# Patient Record
Sex: Male | Born: 1995
Health system: Southern US, Community
[De-identification: ages and names within clinical notes are randomized; demographics above are authoritative.]

## PROBLEM LIST (undated history)

## (undated) DIAGNOSIS — K219 Gastro-esophageal reflux disease without esophagitis: Secondary | ICD-10-CM

## (undated) HISTORY — PX: DENTAL EXAMINATION UNDER ANESTHESIA: SHX1447

---
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
## 2019-08-12 ENCOUNTER — Other Ambulatory Visit: Payer: Self-pay

## 2019-08-12 ENCOUNTER — Ambulatory Visit: Payer: 59 | Admitting: Emergency Medicine

## 2019-08-12 ENCOUNTER — Encounter: Payer: Self-pay | Admitting: Emergency Medicine

## 2019-08-12 VITALS — BP 119/66 | HR 61 | Temp 99.2°F | Resp 16 | Ht 70.0 in | Wt 166.0 lb

## 2019-08-12 DIAGNOSIS — J020 Streptococcal pharyngitis: Secondary | ICD-10-CM

## 2019-08-12 LAB — POCT RAPID STREP A (OFFICE): Rapid Strep A Screen: NEGATIVE

## 2019-08-12 MED ORDER — PREDNISONE 10 MG (21) PO TBPK: ORAL_TABLET | ORAL | 0 refills | Status: DC

## 2019-08-12 NOTE — Progress Notes (Signed)
  Occupational Health Provider Note       Time seen: 4:36 PM    I have reviewed the vital signs and the nursing notes.  HISTORY   Chief Complaint Sore Throat    HPI Brian Hancock is a 24 y.o. male with no known past medical history who presents today for feeling like his throat was closing. Patient complains of sore throat but is more concerned he may be having a reaction that was causing his throat to close. He denies fevers, chills, chest pain, shortness of breath, vomiting or diarrhea. Patient denies any loss of taste or smell.  No past medical history on file.  Allergies Patient has no known allergies.  Review of Systems Constitutional: Negative for fever HEENT exam: Positive for sore throat difficulty swallowing Cardiovascular: Negative for chest pain. Respiratory: Negative for shortness of breath. Gastrointestinal: Negative for abdominal pain, vomiting and diarrhea. Musculoskeletal: Negative for back pain. Skin: Negative for rash. Neurological: Negative for headaches, focal weakness or numbness.  All systems negative/normal/unremarkable except as stated in the HPI  ____________________________________________   PHYSICAL EXAM:  VITAL SIGNS: Vitals:   08/12/19 1612  BP: 119/66  Pulse: 61  Resp: 16  Temp: 99.2 F (37.3 C)  SpO2: 100%    Constitutional: Alert and oriented. Well appearing and in no distress. Eyes: Conjunctivae are normal. Normal extraocular movements. ENT      Head: Normocephalic and atraumatic.      Nose: No congestion/rhinnorhea.      Mouth/Throat: Mucous membranes are moist. Posterior erythema is noted      Neck: No stridor. Cardiovascular: Normal rate, regular rhythm. No murmurs, rubs, or gallops. Respiratory: Normal respiratory effort without tachypnea nor retractions. Breath sounds are clear and equal bilaterally. No wheezes/rales/rhonchi. Musculoskeletal: Nontender with normal range of motion in extremities. No lower extremity  tenderness nor edema. Neurologic:  Normal speech and language. No gross focal neurologic deficits are appreciated.  Skin:  Skin is warm, dry and intact. No rash noted. Psychiatric: Speech and behavior are normal.  ____________________________________________   LABS (pertinent positives/negatives)  Recent Results (from the past 2160 hour(s))  POCT Rapid Strep A-Office (CPT [phone])     Status: Normal   Collection Time: 08/12/19  4:27 PM  Result Value Ref Range   Rapid Strep A Screen Negative Negative    DIFFERENTIAL DIAGNOSIS  Pharyngitis, postnasal drip, allergies, allergic reaction, tonsillitis  ASSESSMENT AND PLAN  Pharyngitis   Plan: The patient had presented for feeling like his throat was closing. Patient's labs were negative for strep. This does not look bacterial at this time, I will try steroid taper pack which should resolve his symptoms. If not he already has scheduled ENT referral.  Daryel November MD    Note: This note was generated in part or whole with voice recognition software. Voice recognition is usually quite accurate but there are transcription errors that can and very often do occur. I apologize for any typographical errors that were not detected and corrected.

## 2019-08-17 ENCOUNTER — Ambulatory Visit: Payer: Self-pay | Admitting: Physician Assistant

## 2019-08-17 ENCOUNTER — Other Ambulatory Visit: Payer: Self-pay

## 2019-08-17 VITALS — BP 118/71 | HR 60 | Temp 98.5°F | Resp 14 | Ht 69.0 in | Wt 163.0 lb

## 2019-08-17 DIAGNOSIS — J029 Acute pharyngitis, unspecified: Secondary | ICD-10-CM

## 2019-08-17 NOTE — Progress Notes (Signed)
° °  Subjective: Sore throat    Patient ID: Brian Hancock, male    DOB: 12/05/1995, 24 y.o.   MRN: [id number]  HPI Patient follow-up for sore throat from visit last week.  Patient stated no relief with steroids.  Patient denies fever, chills, dyspnea, vomiting, or diarrhea.  Patient states mild upper gastric discomfort.  Patient states tolerates soft foods and fluids with discomfort.  Review of Systems    Negative set for complaint Objective:   Physical Exam  HEENT is unremarkable.  No cervical adenopathy.      Assessment & Plan: Pharyngitis.  Differential consist viral tonsillitis, allergic reaction, and GERD.  Patient advised to take over-the-counter Pepcid and will be scheduled ENT consult.  Follow-up after ENT evaluation.

## 2019-08-17 NOTE — Progress Notes (Signed)
Pt has finished rounds of steroids this morning and his throat still hurts. Pt states it hasnt got worse that its still the same. CL/RMA

## 2019-08-17 NOTE — Addendum Note (Signed)
Addended by: Gardner Candle on: 08/17/2019 10:22 AM   Modules accepted: Orders

## 2019-08-18 ENCOUNTER — Telehealth: Payer: Self-pay

## 2019-08-18 ENCOUNTER — Encounter: Payer: Self-pay | Admitting: Emergency Medicine

## 2019-08-18 ENCOUNTER — Emergency Department: Payer: 59

## 2019-08-18 ENCOUNTER — Other Ambulatory Visit: Payer: Self-pay

## 2019-08-18 ENCOUNTER — Emergency Department
Admission: EM | Admit: 2019-08-18 | Discharge: 2019-08-18 | Disposition: A | Discharge status: Home / Self Care | Payer: 59 | Attending: Emergency Medicine | Admitting: Emergency Medicine

## 2019-08-18 DIAGNOSIS — R1310 Dysphagia, unspecified: Secondary | ICD-10-CM

## 2019-08-18 DIAGNOSIS — R0602 Shortness of breath: Secondary | ICD-10-CM | POA: Diagnosis not present

## 2019-08-18 DIAGNOSIS — R918 Other nonspecific abnormal finding of lung field: Secondary | ICD-10-CM | POA: Diagnosis not present

## 2019-08-18 LAB — CBC WITH DIFFERENTIAL/PLATELET
Abs Immature Granulocytes: 0.03 10*3/uL (ref 0.00–0.07)
Basophils Absolute: 0.1 10*3/uL (ref 0.0–0.1)
Basophils Relative: 1 %
Eosinophils Absolute: 0.3 10*3/uL (ref 0.0–0.5)
Eosinophils Relative: 4 %
HCT: 45.2 % (ref 39.0–52.0)
Hemoglobin: 15.9 g/dL (ref 13.0–17.0)
Immature Granulocytes: 0 %
Lymphocytes Relative: 47 %
Lymphs Abs: 3.4 10*3/uL (ref 0.7–4.0)
MCH: 31.8 pg (ref 26.0–34.0)
MCHC: 35.2 g/dL (ref 30.0–36.0)
MCV: 90.4 fL (ref 80.0–100.0)
Monocytes Absolute: 0.6 10*3/uL (ref 0.1–1.0)
Monocytes Relative: 9 %
Neutro Abs: 2.8 10*3/uL (ref 1.7–7.7)
Neutrophils Relative %: 39 %
Platelets: 312 10*3/uL (ref 150–400)
RBC: 5 MIL/uL (ref 4.22–5.81)
RDW: 11.7 % (ref 11.5–15.5)
WBC: 7.2 10*3/uL (ref 4.0–10.5)
nRBC: 0 % (ref 0.0–0.2)

## 2019-08-18 LAB — BASIC METABOLIC PANEL
Anion gap: 8 (ref 5–15)
BUN: 14 mg/dL (ref 6–20)
CO2: 29 mmol/L (ref 22–32)
Calcium: 9.3 mg/dL (ref 8.9–10.3)
Chloride: 101 mmol/L (ref 98–111)
Creatinine, Ser: 0.98 mg/dL (ref 0.61–1.24)
GFR calc Af Amer: >60 mL/min (ref >60)
GFR calc non Af Amer: >60 mL/min (ref >60)
Glucose, Bld: 105 mg/dL — ABNORMAL HIGH (ref 70–99)
Potassium: 3.5 mmol/L (ref 3.5–5.1)
Sodium: 138 mmol/L (ref 135–145)

## 2019-08-18 MED ORDER — OMEPRAZOLE 20 MG PO CPDR: 40.0000 mg | DELAYED_RELEASE_CAPSULE | Freq: Two times a day (BID) | ORAL | 2 refills | Status: DC

## 2019-08-18 NOTE — ED Triage Notes (Signed)
Patient to ER for c/o shortness of breath/difficulty swallowing x1 week. Patient states he has seen PCP for sensation that food is sitting at esophagus/epigastric area. Patient states symptoms have progressed and feels like he has been unable to eat well the last 3-4 days. Reports difficulty swallowing has improved with prednisone, but still has sensation that food is sitting. Now describes a shortness of breath feeling.

## 2019-08-18 NOTE — Telephone Encounter (Signed)
Per Dr. Allegra Lai patient needs a urgent EGD. Scheduled patient for 08/24/2019. Went over instructions with patient and mailed them. Patient will go for COVID test on 08/20/2019.

## 2019-08-18 NOTE — ED Provider Notes (Signed)
[facility] Emergency Department Provider Note   ____________________________________________   First MD Initiated Contact with Patient 08/18/19 1419     (approximate)  I have reviewed the triage vital signs and the nursing notes.   HISTORY  Chief Complaint Shortness of Breath and Dysphagia    HPI Brian Hancock is a 24 y.o. male with no significant past medical history who presents to the ED complaining of difficulty swallowing.  Patient reports that about 1 week ago he first developed a feeling of tightness in his throat.  He was initially evaluated at a walk-in clinic where there was concern for pharyngitis and he was started on a steroid taper.  Strep testing was negative at that time.  Symptoms did not improve after starting the steroids and he now feels like the tightness extends down into the middle of his chest.  He has significant difficulty swallowing, states whenever he tries to eat solids it gets stuck in his lower neck and mid chest, very gradually progressing through to his stomach.  He has taken Pepcid as needed with no relief of his symptoms.  He has been drinking protein shakes without difficulty and denies any vomiting.  He has never had similar symptoms in the past.  He currently denies any chest pain or difficulty breathing.        History reviewed. No pertinent past medical history.  There are no problems to display for this patient.   Past Surgical History:  Procedure Laterality Date   DENTAL EXAMINATION UNDER ANESTHESIA      Prior to Admission medications   Medication Sig Start Date End Date Taking? Authorizing Provider  cetirizine (ZYRTEC) 10 MG tablet Take 10 mg by mouth daily.    [provider]  omeprazole (PRILOSEC) 20 MG capsule Take 2 capsules (40 mg total) by mouth 2 (two) times daily before a meal. 08/18/19 10/02/19  Blake Divine, MD    Allergies Patient has no known allergies.  Family History  Problem  Relation Age of Onset   Skin cancer Mother    Skin cancer Maternal Grandmother    Skin cancer Paternal Grandfather     Social History Social History   Tobacco Use   Smoking status: Never Smoker   Smokeless tobacco: Current User    Types: Chew  Substance Use Topics   Alcohol use: Yes    Alcohol/week: 4.0 standard drinks    Types: 4 Cans of beer per week    Comment: a week   Drug use: Never    Review of Systems  Constitutional: No fever/chills Eyes: No visual changes. ENT: No sore throat. Cardiovascular: Denies chest pain. Respiratory: Denies shortness of breath. Gastrointestinal: No abdominal pain.  No nausea, no vomiting.  No diarrhea.  No constipation.  Positive for dysphagia. Genitourinary: Negative for dysuria. Musculoskeletal: Negative for back pain. Skin: Negative for rash. Neurological: Negative for headaches, focal weakness or numbness.  ____________________________________________   PHYSICAL EXAM:  VITAL SIGNS: ED Triage Vitals  Enc Vitals Group     BP 08/18/19 1314 124/74     Pulse Rate 08/18/19 1314 65     Resp 08/18/19 1314 18     Temp 08/18/19 1314 98.3 F (36.8 C)     Temp Source 08/18/19 1314 Oral     SpO2 08/18/19 1314 100 %     Weight 08/18/19 1316 163 lb (73.9 kg)     Height 08/18/19 1316 5\' 9"  (1.753 m)     Head Circumference --  Peak Flow --      Pain Score 08/18/19 1316 0     Pain Loc --      Pain Edu? --      Excl. in GC? --     Constitutional: Alert and oriented. Eyes: Conjunctivae are normal. Head: Atraumatic. Nose: No congestion/rhinnorhea. Mouth/Throat: Mucous membranes are moist.  Oropharynx clear with no erythema or edema. Neck: Normal ROM Cardiovascular: Normal rate, regular rhythm. Grossly normal heart sounds. Respiratory: Normal respiratory effort.  No retractions. Lungs CTAB. Gastrointestinal: Soft and nontender. No distention. Genitourinary: deferred Musculoskeletal: No lower extremity tenderness nor  edema. Neurologic:  Normal speech and language. No gross focal neurologic deficits are appreciated. Skin:  Skin is warm, dry and intact. No rash noted. Psychiatric: Mood and affect are normal. Speech and behavior are normal.  ____________________________________________   LABS (all labs ordered are listed, but only abnormal results are displayed)  Labs Reviewed  BASIC METABOLIC PANEL - Abnormal; Notable for the following components:      Result Value   Glucose, Bld 105 (*)    All other components within normal limits  CBC WITH DIFFERENTIAL/PLATELET   ____________________________________________  EKG  ED ECG REPORT I, Chesley Noon, the attending physician, personally viewed and interpreted this ECG.   Date: 08/18/2019  EKG Time: 13:08  Rate: 70  Rhythm: normal sinus rhythm  Axis: Normal  Intervals:none  ST&T Change: None   PROCEDURES  Procedure(s) performed (including Critical Care):  Procedures   ____________________________________________   INITIAL IMPRESSION / ASSESSMENT AND PLAN / ED COURSE       24 year old male with no significant past medical history presents to the ED after he initially developed tightness in his throat about 1 week ago, subsequently has developed increasing difficulty swallowing solids, feeling like they get stuck in the center of his chest.  EKG shows no evidence of arrhythmia or ischemia and chest x-ray negative for acute process.  Lab work also unremarkable and I doubt acute cardiac or pulmonary etiology.  His symptoms seem most likely to be esophageal in origin and we will further assess with barium swallow.  Barium swallow is unremarkable, patient is tolerating liquids here in the ED without difficulty.  Case was discussed with Dr. Allegra Lai of GI, who recommends starting patient on 40 mg omeprazole twice daily and will assist with follow-up for potential endoscopy.  Patient counseled to return to the ED for any new or worsening symptoms,  patient and family agree with plan.      ____________________________________________   FINAL CLINICAL IMPRESSION(S) / ED DIAGNOSES  Final diagnoses:  Dysphagia     ED Discharge Orders         Ordered    omeprazole (PRILOSEC) 20 MG capsule  2 times daily before meals     08/18/19 1541           Note:  This document was prepared using Dragon voice recognition software and may include unintentional dictation errors.   Chesley Noon, MD 08/18/19 [phone]

## 2019-08-20 ENCOUNTER — Other Ambulatory Visit: Payer: Self-pay

## 2019-08-20 ENCOUNTER — Other Ambulatory Visit
Admission: RE | Admit: 2019-08-20 | Discharge: 2019-08-20 | Disposition: A | Discharge status: Home / Self Care | Payer: 59 | Source: Ambulatory Visit | Attending: Gastroenterology | Admitting: Gastroenterology

## 2019-08-20 DIAGNOSIS — Z20822 Contact with and (suspected) exposure to covid-19: Secondary | ICD-10-CM | POA: Insufficient documentation

## 2019-08-20 DIAGNOSIS — Z01812 Encounter for preprocedural laboratory examination: Secondary | ICD-10-CM | POA: Insufficient documentation

## 2019-08-20 LAB — SARS CORONAVIRUS 2 (TAT 6-24 HRS): SARS Coronavirus 2: NEGATIVE

## 2019-08-21 ENCOUNTER — Encounter: Payer: Self-pay | Admitting: Gastroenterology

## 2019-08-24 ENCOUNTER — Ambulatory Visit: Payer: 59 | Admitting: Registered Nurse

## 2019-08-24 ENCOUNTER — Encounter
Admission: RE | Disposition: A | Discharge status: Home / Self Care | Payer: Self-pay | Source: Home / Self Care | Attending: Gastroenterology

## 2019-08-24 ENCOUNTER — Ambulatory Visit
Admission: RE | Admit: 2019-08-24 | Discharge: 2019-08-24 | Disposition: A | Discharge status: Home / Self Care | Payer: 59 | Attending: Gastroenterology | Admitting: Gastroenterology

## 2019-08-24 ENCOUNTER — Other Ambulatory Visit: Payer: Self-pay

## 2019-08-24 ENCOUNTER — Encounter: Payer: Self-pay | Admitting: Gastroenterology

## 2019-08-24 DIAGNOSIS — Z79899 Other long term (current) drug therapy: Secondary | ICD-10-CM | POA: Diagnosis not present

## 2019-08-24 DIAGNOSIS — R1310 Dysphagia, unspecified: Secondary | ICD-10-CM | POA: Diagnosis not present

## 2019-08-24 DIAGNOSIS — K219 Gastro-esophageal reflux disease without esophagitis: Secondary | ICD-10-CM | POA: Diagnosis not present

## 2019-08-24 DIAGNOSIS — K228 Other specified diseases of esophagus: Secondary | ICD-10-CM | POA: Diagnosis not present

## 2019-08-24 DIAGNOSIS — R1314 Dysphagia, pharyngoesophageal phase: Secondary | ICD-10-CM | POA: Diagnosis not present

## 2019-08-24 HISTORY — DX: Gastro-esophageal reflux disease without esophagitis: K21.9

## 2019-08-24 HISTORY — PX: ESOPHAGOGASTRODUODENOSCOPY (EGD) WITH PROPOFOL: SHX5813

## 2019-08-24 SURGERY — ESOPHAGOGASTRODUODENOSCOPY (EGD) WITH PROPOFOL
Anesthesia: General

## 2019-08-24 MED ORDER — SODIUM CHLORIDE 0.9 % IV SOLN: INTRAVENOUS | Status: DC

## 2019-08-24 MED ORDER — PROPOFOL 500 MG/50ML IV EMUL
INTRAVENOUS | Status: DC | PRN
  Administered 2019-08-24: 200 ug/kg/min via INTRAVENOUS

## 2019-08-24 MED ORDER — GLYCOPYRROLATE 0.2 MG/ML IJ SOLN
INTRAMUSCULAR | Status: AC
  Filled 2019-08-24: qty 1

## 2019-08-24 MED ORDER — LABETALOL HCL 5 MG/ML IV SOLN
INTRAVENOUS | Status: AC
  Filled 2019-08-24: qty 4

## 2019-08-24 MED ORDER — PROPOFOL 10 MG/ML IV BOLUS
INTRAVENOUS | Status: AC
  Filled 2019-08-24: qty 20

## 2019-08-24 MED ORDER — DEXMEDETOMIDINE HCL 200 MCG/2ML IV SOLN
INTRAVENOUS | Status: DC | PRN
  Administered 2019-08-24: 20 ug via INTRAVENOUS
  Administered 2019-08-24: 8 ug via INTRAVENOUS

## 2019-08-24 MED ORDER — PROPOFOL 10 MG/ML IV BOLUS
INTRAVENOUS | Status: DC | PRN
  Administered 2019-08-24: 100 mg via INTRAVENOUS

## 2019-08-24 MED ORDER — GLYCOPYRROLATE 0.2 MG/ML IJ SOLN
INTRAMUSCULAR | Status: DC | PRN
  Administered 2019-08-24: .2 mg via INTRAVENOUS

## 2019-08-24 MED ORDER — LIDOCAINE HCL (CARDIAC) PF 100 MG/5ML IV SOSY
PREFILLED_SYRINGE | INTRAVENOUS | Status: DC | PRN
  Administered 2019-08-24: 100 mg via INTRAVENOUS

## 2019-08-24 NOTE — H&P (Signed)
Arlyss Repress, MD [address]  [address]  [place], [place] [address]  Main: [phone]  Fax: [phone] Pager: [phone]  Primary Care Physician:  Patient, No Pcp Per Primary Gastroenterologist:  Dr. Arlyss Repress  Pre-Procedure History & Physical: HPI:  Brian Hancock is a 24 y.o. male is here for an endoscopy.   Past Medical History:  Diagnosis Date  . GERD (gastroesophageal reflux disease)     Past Surgical History:  Procedure Laterality Date  . DENTAL EXAMINATION UNDER ANESTHESIA      Prior to Admission medications   Medication Sig Start Date End Date Taking? Authorizing Provider  cetirizine (ZYRTEC) 10 MG tablet Take 10 mg by mouth daily.   Yes [provider]  omeprazole (PRILOSEC) 20 MG capsule Take 2 capsules (40 mg total) by mouth 2 (two) times daily before a meal. 08/18/19 10/02/19 Yes Chesley Noon, MD    Allergies as of 08/19/2019  . (No Known Allergies)    Family History  Problem Relation Age of Onset  . Skin cancer Mother   . Skin cancer Maternal Grandmother   . Skin cancer Paternal Grandfather     Social History   Socioeconomic History  . Marital status: Single    Spouse name: Not on file  . Number of children: Not on file  . Years of education: Not on file  . Highest education level: Not on file  Occupational History  . Not on file  Tobacco Use  . Smoking status: Never Smoker  . Smokeless tobacco: Current User    Types: Chew  Vaping Use  . Vaping Use: Never used  Substance and Sexual Activity  . Alcohol use: Yes    Alcohol/week: 4.0 standard drinks    Types: 4 Cans of beer per week    Comment: a week  . Drug use: Never  . Sexual activity: Not on file  Other Topics Concern  . Not on file  Social History Narrative  . Not on file   Social Determinants of Health   Financial Resource Strain:   . Difficulty of Paying Living Expenses:   Food Insecurity:   . Worried About Programme researcher, broadcasting/film/video in the Last Year:     . Barista in the Last Year:   Transportation Needs:   . Freight forwarder (Medical):   Marland Kitchen Lack of Transportation (Non-Medical):   Physical Activity:   . Days of Exercise per Week:   . Minutes of Exercise per Session:   Stress:   . Feeling of Stress :   Social Connections:   . Frequency of Communication with Friends and Family:   . Frequency of Social Gatherings with Friends and Family:   . Attends Religious Services:   . Active Member of Clubs or Organizations:   . Attends Banker Meetings:   Marland Kitchen Marital Status:   Intimate Partner Violence:   . Fear of Current or Ex-Partner:   . Emotionally Abused:   Marland Kitchen Physically Abused:   . Sexually Abused:     Review of Systems: See HPI, otherwise negative ROS  Physical Exam: BP 124/75   Pulse 60   Temp 98 F (36.7 C) (Temporal)   Resp 15   Ht 5\' 9"  (1.753 m)   Wt 71.2 kg   SpO2 100%   BMI 23.18 kg/m  General:   Alert,  pleasant and cooperative in NAD Head:  Normocephalic and atraumatic. Neck:  Supple; no masses or thyromegaly. Lungs:  Clear throughout to auscultation.    Heart:  Regular rate and rhythm. Abdomen:  Soft, nontender and nondistended. Normal bowel sounds, without guarding, and without rebound.   Neurologic:  Alert and  oriented x4;  grossly normal neurologically.  Impression/Plan: Brian Hancock is here for an endoscopy to be performed for dysphagia  Risks, benefits, limitations, and alternatives regarding  endoscopy have been reviewed with the patient.  Questions have been answered.  All parties agreeable.   Sherri Sear, MD  08/24/2019, 11:51 AM

## 2019-08-24 NOTE — Op Note (Signed)
[facility] Gastroenterology Patient Name: Brian Hancock Procedure Date: 08/24/2019 12:39 PM MRN: [id number] Account #: [id number] Date of Birth: Mar 08, 1996 Admit Type: Outpatient Age: 24 Room: [facility] ENDO ROOM 1 Gender: Male Note Status: Finalized Procedure:             Upper GI endoscopy Indications:           Esophageal dysphagia Providers:             Lin Landsman MD, MD Medicines:             Monitored Anesthesia Care Complications:         No immediate complications. Estimated blood loss: None. Procedure:             Pre-Anesthesia Assessment:                        - Prior to the procedure, a History and Physical was                         performed, and patient medications and allergies were                         reviewed. The patient is competent. The risks and                         benefits of the procedure and the sedation options and                         risks were discussed with the patient. All questions                         were answered and informed consent was obtained.                         Patient identification and proposed procedure were                         verified by the physician, the nurse, the                         anesthesiologist, the anesthetist and the technician                         in the pre-procedure area in the procedure room in the                         endoscopy suite. Mental Status Examination: alert and                         oriented. Airway Examination: normal oropharyngeal                         airway and neck mobility. Respiratory Examination:                         clear to auscultation. CV Examination: normal.                         Prophylactic Antibiotics: The patient does not require  prophylactic antibiotics. Prior Anticoagulants: The                         patient has taken no previous anticoagulant or                         antiplatelet agents. ASA Grade  Assessment: II - A                         patient with mild systemic disease. After reviewing                         the risks and benefits, the patient was deemed in                         satisfactory condition to undergo the procedure. The                         anesthesia plan was to use monitored anesthesia care                         (MAC). Immediately prior to administration of                         medications, the patient was re-assessed for adequacy                         to receive sedatives. The heart rate, respiratory                         rate, oxygen saturations, blood pressure, adequacy of                         pulmonary ventilation, and response to care were                         monitored throughout the procedure. The physical                         status of the patient was re-assessed after the                         procedure.                        After obtaining informed consent, the endoscope was                         passed under direct vision. Throughout the procedure,                         the patient's blood pressure, pulse, and oxygen                         saturations were monitored continuously. The Endoscope                         was introduced through the mouth, and advanced to the  second part of duodenum. The upper GI endoscopy was                         accomplished without difficulty. The patient tolerated                         the procedure well. Findings:      The duodenal bulb and second portion of the duodenum were normal.      The entire examined stomach was normal.      The cardia and gastric fundus were normal on retroflexion.      Esophagogastric landmarks were identified: the gastroesophageal junction       was found at 40 cm from the incisors.      Mucosal changes including longitudinal markings and mucosal friability       were found in the lower third of the esophagus. Esophageal findings  were       graded using the Eosinophilic Esophagitis Endoscopic Reference Score       (EoE-EREFS) as: Edema Grade 0 Normal (distinct vascular markings), Rings       Grade 0 None (no ridges or rings seen), Exudates Grade 0 None (no white       lesions seen), Furrows Grade 0 None (no vertical lines seen) and       Stricture none (no stricture found). Biopsies were obtained from the       proximal and distal esophagus with cold forceps for histology of       suspected eosinophilic esophagitis. Impression:            - Normal duodenal bulb and second portion of the                         duodenum.                        - Normal stomach.                        - Esophagogastric landmarks identified.                        - Esophageal mucosal changes suspicious for                         eosinophilic esophagitis. Biopsied. Recommendation:        - Discharge patient to home (with escort).                        - Resume previous diet today.                        - Continue present medications.                        - Await pathology results.                        - Return to my office in 2 months. Procedure Code(s):     --- Professional ---                        [phone], Esophagogastroduodenoscopy, flexible,  transoral; with biopsy, single or multiple Diagnosis Code(s):     --- Professional ---                        K22.8, Other specified diseases of esophagus                        R13.14, Dysphagia, pharyngoesophageal phase CPT copyright 2019 American Medical Association. All rights reserved. The codes documented in this report are preliminary and upon coder review may  be revised to meet current compliance requirements. Dr. Ulyess Mort Lin Landsman MD, MD 08/24/2019 12:58:32 PM This report has been signed electronically. Number of Addenda: 0 Note Initiated On: 08/24/2019 12:39 PM Estimated Blood Loss:  Estimated blood loss: none.      [facility]

## 2019-08-24 NOTE — Anesthesia Preprocedure Evaluation (Signed)
Anesthesia Evaluation  Patient identified by MRN, date of birth, ID band Patient awake    Reviewed: Allergy & Precautions, NPO status , Patient's Chart, lab work & pertinent test results  History of Anesthesia Complications Negative for: history of anesthetic complications  Airway Mallampati: II       Dental   Pulmonary neg sleep apnea, neg COPD, Not current smoker,           Cardiovascular (-) hypertension(-) Past MI and (-) CHF (-) dysrhythmias (-) Valvular Problems/Murmurs     Neuro/Psych neg Seizures    GI/Hepatic Neg liver ROS, GERD  Medicated and Controlled,  Endo/Other  neg diabetes  Renal/GU negative Renal ROS     Musculoskeletal   Abdominal   Peds  Hematology   Anesthesia Other Findings   Reproductive/Obstetrics                             Anesthesia Physical Anesthesia Plan  ASA: II  Anesthesia Plan: General   Post-op Pain Management:    Induction: Intravenous  PONV Risk Score and Plan: Propofol infusion and TIVA  Airway Management Planned: Nasal Cannula  Additional Equipment:   Intra-op Plan:   Post-operative Plan:   Informed Consent: I have reviewed the patients History and Physical, chart, labs and discussed the procedure including the risks, benefits and alternatives for the proposed anesthesia with the patient or authorized representative who has indicated his/her understanding and acceptance.       Plan Discussed with:   Anesthesia Plan Comments:         Anesthesia Quick Evaluation

## 2019-08-24 NOTE — Transfer of Care (Signed)
Immediate Anesthesia Transfer of Care Note  Patient: Brian Hancock  Procedure(s) Performed: ESOPHAGOGASTRODUODENOSCOPY (EGD) WITH PROPOFOL (N/A )  Patient Location: Endoscopy Unit  Anesthesia Type:General  Level of Consciousness: drowsy  Airway & Oxygen Therapy: Patient Spontanous Breathing  Post-op Assessment: Report given to RN and Post -op Vital signs reviewed and stable  Post vital signs: Reviewed and stable  Last Vitals:  Vitals Value Taken Time  BP    Temp    Pulse 55 08/24/19 1302  Resp 19 08/24/19 1302  SpO2 96 % 08/24/19 1302  Vitals shown include unvalidated device data.  Last Pain:  Vitals:   08/24/19 1108  TempSrc: Temporal  PainSc: 0-No pain         Complications: No complications documented.

## 2019-08-25 ENCOUNTER — Encounter: Payer: Self-pay | Admitting: Gastroenterology

## 2019-08-25 LAB — SURGICAL PATHOLOGY

## 2019-08-25 NOTE — Anesthesia Postprocedure Evaluation (Signed)
Anesthesia Post Note  Patient: Brian Hancock  Procedure(s) Performed: ESOPHAGOGASTRODUODENOSCOPY (EGD) WITH PROPOFOL (N/A )  Patient location during evaluation: PACU Anesthesia Type: General Level of consciousness: awake and alert Pain management: pain level controlled Vital Signs Assessment: post-procedure vital signs reviewed and stable Respiratory status: spontaneous breathing, nonlabored ventilation and respiratory function stable Cardiovascular status: blood pressure returned to baseline and stable Postop Assessment: no apparent nausea or vomiting Anesthetic complications: no   No complications documented.   Last Vitals:  Vitals:   08/24/19 1323 08/24/19 1333  BP: (!) 98/51 108/66  Pulse: (!) 55 (!) 50  Resp: 16 17  Temp:    SpO2: 99% 99%    Last Pain:  Vitals:   08/25/19 0738  TempSrc:   PainSc: 0-No pain                 Karleen Hampshire

## 2019-08-31 ENCOUNTER — Other Ambulatory Visit: Payer: Self-pay

## 2019-08-31 MED ORDER — OMEPRAZOLE 20 MG PO CPDR: 40.0000 mg | DELAYED_RELEASE_CAPSULE | Freq: Two times a day (BID) | ORAL | 2 refills | Status: DC

## 2019-08-31 NOTE — Telephone Encounter (Signed)
Patient is calling patient states he was seen on 08/18/2019 for Dysphagia in the ER. Had EGD done on 08/24/2019. Patient wants refill on omeprazole. Has appointment with you on 09/24/2019

## 2019-09-01 MED ORDER — OMEPRAZOLE 20 MG PO CPDR: 40.0000 mg | DELAYED_RELEASE_CAPSULE | Freq: Two times a day (BID) | ORAL | 2 refills | Status: DC

## 2019-09-01 NOTE — Addendum Note (Signed)
Addended by: Radene Knee L on: 09/01/2019 07:45 AM   Modules accepted: Orders

## 2019-09-07 ENCOUNTER — Other Ambulatory Visit: Payer: Self-pay

## 2019-09-07 ENCOUNTER — Telehealth: Payer: Self-pay | Admitting: Gastroenterology

## 2019-09-07 MED ORDER — OMEPRAZOLE 20 MG PO CPDR: 40.0000 mg | DELAYED_RELEASE_CAPSULE | Freq: Two times a day (BID) | ORAL | 2 refills | Status: DC

## 2019-09-07 NOTE — Telephone Encounter (Signed)
error 

## 2019-09-07 NOTE — Telephone Encounter (Signed)
Patient is calling because he only got 30 capsules of his omeprazole. Pharmacy states they need a new RX sent it to the pharmacy informed patient

## 2019-09-08 ENCOUNTER — Telehealth: Payer: Self-pay

## 2019-09-08 NOTE — Telephone Encounter (Signed)
Medication was approved by insurance and called and informed patient of this information

## 2019-09-08 NOTE — Telephone Encounter (Signed)
Pharmacy called to state that patient needed a PA on Omeprazole 20mg  take 2 capsules twice a day. Did PA through cover my meds. Patient left a voicemail stating he had a question about medication. Called patient back and informed patient his medication needed a PA and this was sent off to insurance this morning. Informed patient that this could take 24-72 hours for insurance to make a decision. Patient states he runs out of medication tomorrow. Informed patient this was available otc but would let him know as soon as I hear back from insurance

## 2019-09-24 ENCOUNTER — Other Ambulatory Visit: Payer: Self-pay

## 2019-09-24 ENCOUNTER — Encounter: Payer: Self-pay | Admitting: Gastroenterology

## 2019-09-24 ENCOUNTER — Ambulatory Visit (INDEPENDENT_AMBULATORY_CARE_PROVIDER_SITE_OTHER): Payer: 59 | Admitting: Gastroenterology

## 2019-09-24 VITALS — BP 115/72 | HR 76 | Temp 98.0°F | Wt 163.0 lb

## 2019-09-24 DIAGNOSIS — K200 Eosinophilic esophagitis: Secondary | ICD-10-CM | POA: Diagnosis not present

## 2019-09-24 MED ORDER — OMEPRAZOLE 40 MG PO CPDR: 40.0000 mg | DELAYED_RELEASE_CAPSULE | Freq: Two times a day (BID) | ORAL | 2 refills | Status: DC

## 2019-09-24 NOTE — Progress Notes (Signed)
Arlyss Repress, MD [address]  [address]  [place], [place] [address]  Main: [phone]  Fax: [phone]    Gastroenterology Consultation  Referring Provider:     No ref. provider found Primary Care Physician:  Patient, No Pcp Per Primary Gastroenterologist:  Dr. Arlyss Repress Reason for Consultation:     Eosinophilic esophagitis        HPI:   Brian Hancock is a 24 y.o. male referred by Dr. Patient, No Pcp Per  for consultation & management of history of leg esophagitis.  Patient was originally seen at [facility] ER on 08/18/2019 secondary to progressively worsening dysphagia to solids associated with choking.  He was originally prescribed a short course of prednisone as it was thought that he may had an allergic reaction.  However, given that his symptoms progressed, he was referred to GI.  I performed his upper endoscopy in 08/2019, confirmed eosinophilic esophagitis.  He was maintained on omeprazole twice daily.  Today, he is here to establish care as outpatient.  Patient is accompanied by his stepmother.  He reports that since his upper endoscopy, he did not have further episodes of dysphagia.  He thinks his symptoms have resolved and he is able to tolerate regular foods.  His weight has been stable.  He works for a paper company  He does admit to seasonal allergies.  He denies history of allergic asthma He denies family history of EOE, autoimmune conditions or asthma  NSAIDs: None  Antiplts/Anticoagulants/Anti thrombotics: None  GI Procedures: EGD 08/30/2019 - Normal duodenal bulb and second portion of the duodenum. - Normal stomach. - Esophagogastric landmarks identified. - Esophageal mucosal changes suspicious for eosinophilic esophagitis. Biopsied.  DIAGNOSIS:  A. ESOPHAGUS, DISTAL; COLD BIOPSY:  - BENIGN SQUAMOUS MUCOSA WITH ACANTHOSIS.  - MILD INCREASE IN INTRAEPITHELIAL EOSINOPHILS (UP TO 6 PER HPF)  - NEGATIVE FOR DYSPLASIA OR MALIGNANCY.   B. ESOPHAGUS,  PROXIMAL; COLD BIOPSY:  - BENIGN SQUAMOUS MUCOSA WITH ACANTHOSIS.  - MODERATE INCREASE IN INTRAEPITHELIAL EOSINOPHILS (UP TO 15 PER HPF IN  ONE FOCUS).  - NEGATIVE FOR DYSPLASIA AND MALIGNANCY.   Past Medical History:  Diagnosis Date  . GERD (gastroesophageal reflux disease)     Past Surgical History:  Procedure Laterality Date  . DENTAL EXAMINATION UNDER ANESTHESIA    . ESOPHAGOGASTRODUODENOSCOPY (EGD) WITH PROPOFOL N/A 08/24/2019   Procedure: ESOPHAGOGASTRODUODENOSCOPY (EGD) WITH PROPOFOL;  Surgeon: Toney Reil, MD;  Location[facility] ENDOSCOPY;  Service: Gastroenterology;  Laterality: N/A;    Current Outpatient Medications:  .  omeprazole (PRILOSEC) 40 MG capsule, Take 1 capsule (40 mg total) by mouth 2 (two) times daily before a meal., Disp: 60 capsule, Rfl: 2    Family History  Problem Relation Age of Onset  . Skin cancer Mother   . Skin cancer Maternal Grandmother   . Skin cancer Paternal Grandfather      Social History   Tobacco Use  . Smoking status: Never Smoker  . Smokeless tobacco: Current User    Types: Chew  Vaping Use  . Vaping Use: Never used  Substance Use Topics  . Alcohol use: Yes    Alcohol/week: 4.0 standard drinks    Types: 4 Cans of beer per week    Comment: a week  . Drug use: Never    Allergies as of 09/24/2019  . (No Known Allergies)    Review of Systems:    All systems reviewed and negative except where noted in HPI.   Physical  Exam:  BP 115/72 (BP Location: Left Arm, Patient Position: Sitting, Cuff Size: Normal)   Pulse 76   Temp 98 F (36.7 C) (Oral)   Wt 163 lb (73.9 kg)   BMI 24.07 kg/m  No LMP for male patient.  General:   Alert,  Well-developed, well-nourished, pleasant and cooperative in NAD Head:  Normocephalic and atraumatic. Eyes:  Sclera clear, no icterus.   Conjunctiva pink. Ears:  Normal auditory acuity. Nose:  No deformity, discharge, or lesions. Mouth:  No deformity or lesions,oropharynx pink &  moist. Neck:  Supple; no masses or thyromegaly. Lungs:  Respirations even and unlabored.  Clear throughout to auscultation.   No wheezes, crackles, or rhonchi. No acute distress. Heart:  Regular rate and rhythm; no murmurs, clicks, rubs, or gallops. Abdomen:  Normal bowel sounds. Soft, non-tender and non-distended without masses, hepatosplenomegaly or hernias noted.  No guarding or rebound tenderness.   Rectal: Not performed Msk:  Symmetrical without gross deformities. Good, equal movement & strength bilaterally. Pulses:  Normal pulses noted. Extremities:  No clubbing or edema.  No cyanosis. Neurologic:  Alert and oriented x3;  grossly normal neurologically. Skin:  Intact without significant lesions or rashes. No jaundice. Psych:  Alert and cooperative. Normal mood and affect.  Imaging Studies: Reviewed  Assessment and Plan:   [name] is a 24 y.o. male with no significant past medical history, is here for follow-up of newly diagnosed eosinophilic esophagitis  Today, I have discussed with him various management options of EOE such as PPI use, steroid use, 6 food elimination diet.  I have also discussed with him the sequelae if left untreated including complications  Given that he is in clinical remission, I recommend to continue omeprazole 40 mg 2 times a day for 2 months, repeat EGD with biopsies to assess response to PPI.  If not, will try swallowed fluticasone or budesonide slurry   Follow up in 3 months   Arlyss Repress, MD

## 2019-09-25 ENCOUNTER — Ambulatory Visit: Payer: 59 | Admitting: Gastroenterology

## 2019-09-28 ENCOUNTER — Telehealth: Payer: Self-pay

## 2019-09-28 NOTE — Telephone Encounter (Signed)
I did not realize that he was taking 20 mg 2 capsules twice daily.  Therefore, I mentioned it so.  He is taking the right dose, please tell him that 40 mg is a prescription strength  Charise Leinbach

## 2019-09-28 NOTE — Telephone Encounter (Signed)
Tried to call patient but number was busy  °

## 2019-09-28 NOTE — Telephone Encounter (Signed)
Patient is calling because he is confused in how he is supposed to be taking the omeprazole medication. He states before his appointment he was taking omeprazole 20mg  taking 2 capsules twice a day now a new prescription   is called in for him and it is omeprazole 40mg  1 tablet twice a day. He states he thought you were going to increase his medication

## 2019-09-29 NOTE — Telephone Encounter (Signed)
Patient verbalized understanding of instructions  

## 2019-11-30 ENCOUNTER — Other Ambulatory Visit
Admission: RE | Admit: 2019-11-30 | Discharge: 2019-11-30 | Disposition: A | Discharge status: Home / Self Care | Payer: 59 | Source: Ambulatory Visit | Attending: Gastroenterology | Admitting: Gastroenterology

## 2019-11-30 ENCOUNTER — Other Ambulatory Visit: Payer: Self-pay

## 2019-11-30 DIAGNOSIS — Z20822 Contact with and (suspected) exposure to covid-19: Secondary | ICD-10-CM | POA: Diagnosis not present

## 2019-11-30 DIAGNOSIS — Z01812 Encounter for preprocedural laboratory examination: Secondary | ICD-10-CM | POA: Insufficient documentation

## 2019-12-01 ENCOUNTER — Other Ambulatory Visit: Payer: Self-pay | Admitting: Gastroenterology

## 2019-12-01 LAB — SARS CORONAVIRUS 2 (TAT 6-24 HRS): SARS Coronavirus 2: NEGATIVE

## 2019-12-02 ENCOUNTER — Ambulatory Visit: Payer: 59 | Admitting: Certified Registered Nurse Anesthetist

## 2019-12-02 ENCOUNTER — Other Ambulatory Visit: Payer: Self-pay

## 2019-12-02 ENCOUNTER — Encounter
Admission: RE | Disposition: A | Discharge status: Home / Self Care | Payer: Self-pay | Source: Home / Self Care | Attending: Gastroenterology

## 2019-12-02 ENCOUNTER — Ambulatory Visit
Admission: RE | Admit: 2019-12-02 | Discharge: 2019-12-02 | Disposition: A | Discharge status: Home / Self Care | Payer: 59 | Attending: Gastroenterology | Admitting: Gastroenterology

## 2019-12-02 ENCOUNTER — Encounter: Payer: Self-pay | Admitting: Gastroenterology

## 2019-12-02 DIAGNOSIS — Z79899 Other long term (current) drug therapy: Secondary | ICD-10-CM | POA: Insufficient documentation

## 2019-12-02 DIAGNOSIS — K2950 Unspecified chronic gastritis without bleeding: Secondary | ICD-10-CM | POA: Insufficient documentation

## 2019-12-02 DIAGNOSIS — K219 Gastro-esophageal reflux disease without esophagitis: Secondary | ICD-10-CM | POA: Diagnosis not present

## 2019-12-02 DIAGNOSIS — K3189 Other diseases of stomach and duodenum: Secondary | ICD-10-CM | POA: Insufficient documentation

## 2019-12-02 DIAGNOSIS — R1314 Dysphagia, pharyngoesophageal phase: Secondary | ICD-10-CM | POA: Diagnosis not present

## 2019-12-02 DIAGNOSIS — K200 Eosinophilic esophagitis: Secondary | ICD-10-CM

## 2019-12-02 HISTORY — PX: ESOPHAGOGASTRODUODENOSCOPY (EGD) WITH PROPOFOL: SHX5813

## 2019-12-02 SURGERY — ESOPHAGOGASTRODUODENOSCOPY (EGD) WITH PROPOFOL
Anesthesia: General

## 2019-12-02 MED ORDER — GLYCOPYRROLATE 0.2 MG/ML IJ SOLN
INTRAMUSCULAR | Status: DC | PRN
  Administered 2019-12-02: .2 mg via INTRAVENOUS

## 2019-12-02 MED ORDER — PROPOFOL 10 MG/ML IV BOLUS
INTRAVENOUS | Status: DC | PRN
  Administered 2019-12-02: 80 mg via INTRAVENOUS
  Administered 2019-12-02: 20 mg via INTRAVENOUS

## 2019-12-02 MED ORDER — PROPOFOL 500 MG/50ML IV EMUL
INTRAVENOUS | Status: DC | PRN
  Administered 2019-12-02: 150 ug/kg/min via INTRAVENOUS

## 2019-12-02 MED ORDER — MIDAZOLAM HCL 2 MG/2ML IJ SOLN
INTRAMUSCULAR | Status: AC
  Filled 2019-12-02: qty 2

## 2019-12-02 MED ORDER — PROPOFOL 500 MG/50ML IV EMUL
INTRAVENOUS | Status: AC
  Filled 2019-12-02: qty 50

## 2019-12-02 MED ORDER — EPHEDRINE 5 MG/ML INJ
INTRAVENOUS | Status: AC
  Filled 2019-12-02: qty 10

## 2019-12-02 MED ORDER — GLYCOPYRROLATE 0.2 MG/ML IJ SOLN
INTRAMUSCULAR | Status: AC
  Filled 2019-12-02: qty 1

## 2019-12-02 MED ORDER — LIDOCAINE HCL (PF) 2 % IJ SOLN
INTRAMUSCULAR | Status: AC
  Filled 2019-12-02: qty 5

## 2019-12-02 MED ORDER — MIDAZOLAM HCL 2 MG/2ML IJ SOLN
INTRAMUSCULAR | Status: DC | PRN
  Administered 2019-12-02: 2 mg via INTRAVENOUS

## 2019-12-02 MED ORDER — LIDOCAINE HCL (CARDIAC) PF 100 MG/5ML IV SOSY
PREFILLED_SYRINGE | INTRAVENOUS | Status: DC | PRN
  Administered 2019-12-02: 50 mg via INTRAVENOUS

## 2019-12-02 MED ORDER — SODIUM CHLORIDE 0.9 % IV SOLN: INTRAVENOUS | Status: DC

## 2019-12-02 MED ORDER — EPHEDRINE SULFATE 50 MG/ML IJ SOLN
INTRAMUSCULAR | Status: DC | PRN
  Administered 2019-12-02: 5 mg via INTRAVENOUS

## 2019-12-02 NOTE — H&P (Signed)
Arlyss Repress, MD [address]  [address]  [place], [place] [address]  Main: [phone]  Fax: [phone] Pager: [phone]  Primary Care Physician:  Patient, No Pcp Per Primary Gastroenterologist:  Dr. Arlyss Repress  Pre-Procedure History & Physical: HPI:  Brian Hancock is a 24 y.o. male is here for an endoscopy.   Past Medical History:  Diagnosis Date  . GERD (gastroesophageal reflux disease)     Past Surgical History:  Procedure Laterality Date  . DENTAL EXAMINATION UNDER ANESTHESIA    . ESOPHAGOGASTRODUODENOSCOPY (EGD) WITH PROPOFOL N/A 08/24/2019   Procedure: ESOPHAGOGASTRODUODENOSCOPY (EGD) WITH PROPOFOL;  Surgeon: Toney Reil, MD;  Location: [facility] ENDOSCOPY;  Service: Gastroenterology;  Laterality: N/A;    Prior to Admission medications   Medication Sig Start Date End Date Taking? Authorizing Provider  omeprazole (PRILOSEC) 40 MG capsule Take 1 capsule (40 mg total) by mouth 2 (two) times daily before a meal. 09/24/19 12/23/19 Yes Alizon Schmeling, Loel Dubonnet, MD    Allergies as of 09/24/2019  . (No Known Allergies)    Family History  Problem Relation Age of Onset  . Skin cancer Mother   . Skin cancer Maternal Grandmother   . Skin cancer Paternal Grandfather     Social History   Socioeconomic History  . Marital status: Married    Spouse name: Not on file  . Number of children: Not on file  . Years of education: Not on file  . Highest education level: Not on file  Occupational History  . Not on file  Tobacco Use  . Smoking status: Never Smoker  . Smokeless tobacco: Current User    Types: Chew  Vaping Use  . Vaping Use: Never used  Substance and Sexual Activity  . Alcohol use: Yes    Alcohol/week: 4.0 standard drinks    Types: 4 Cans of beer per week    Comment: a week  . Drug use: Never  . Sexual activity: Not on file  Other Topics Concern  . Not on file  Social History Narrative  . Not on file   Social Determinants of Health    Financial Resource Strain:   . Difficulty of Paying Living Expenses: Not on file  Food Insecurity:   . Worried About Programme researcher, broadcasting/film/video in the Last Year: Not on file  . Ran Out of Food in the Last Year: Not on file  Transportation Needs:   . Lack of Transportation (Medical): Not on file  . Lack of Transportation (Non-Medical): Not on file  Physical Activity:   . Days of Exercise per Week: Not on file  . Minutes of Exercise per Session: Not on file  Stress:   . Feeling of Stress : Not on file  Social Connections:   . Frequency of Communication with Friends and Family: Not on file  . Frequency of Social Gatherings with Friends and Family: Not on file  . Attends Religious Services: Not on file  . Active Member of Clubs or Organizations: Not on file  . Attends Banker Meetings: Not on file  . Marital Status: Not on file  Intimate Partner Violence:   . Fear of Current or Ex-Partner: Not on file  . Emotionally Abused: Not on file  . Physically Abused: Not on file  . Sexually Abused: Not on file    Review of Systems: See HPI, otherwise negative ROS  Physical Exam: BP 124/62   Pulse 69   Temp 97.8 F (36.6 C) (Temporal)  Resp 17   Ht 5\' 7"  (1.702 m)   Wt 77.1 kg   SpO2 100%   BMI 26.63 kg/m  General:   Alert,  pleasant and cooperative in NAD Head:  Normocephalic and atraumatic. Neck:  Supple; no masses or thyromegaly. Lungs:  Clear throughout to auscultation.    Heart:  Regular rate and rhythm. Abdomen:  Soft, nontender and nondistended. Normal bowel sounds, without guarding, and without rebound.   Neurologic:  Alert and  oriented x4;  grossly normal neurologically.  Impression/Plan: Brian Hancock is here for an endoscopy to be performed for h/o EoE  Risks, benefits, limitations, and alternatives regarding  endoscopy have been reviewed with the patient.  Questions have been answered.  All parties agreeable.   Rigoberto Noel, MD  12/02/2019, 8:35 AM

## 2019-12-02 NOTE — Anesthesia Postprocedure Evaluation (Signed)
Anesthesia Post Note  Patient: HENLEY BOETTNER  Procedure(s) Performed: ESOPHAGOGASTRODUODENOSCOPY (EGD) WITH PROPOFOL (N/A )  Patient location during evaluation: PACU Anesthesia Type: General Level of consciousness: awake and alert Pain management: pain level controlled Vital Signs Assessment: post-procedure vital signs reviewed and stable Respiratory status: spontaneous breathing, nonlabored ventilation and respiratory function stable Cardiovascular status: blood pressure returned to baseline and stable Postop Assessment: no apparent nausea or vomiting Anesthetic complications: no   No complications documented.   Last Vitals:  Vitals:   12/02/19 0910 12/02/19 0920  BP: (!) 91/55 (!) 103/59  Pulse: (!) 58 (!) 59  Resp: 17 14  Temp:    SpO2: 98% 99%    Last Pain:  Vitals:   12/02/19 0850  TempSrc: Temporal  PainSc:                  Karleen Hampshire

## 2019-12-02 NOTE — Transfer of Care (Signed)
Immediate Anesthesia Transfer of Care Note  Patient: ACXEL DINGEE  Procedure(s) Performed: ESOPHAGOGASTRODUODENOSCOPY (EGD) WITH PROPOFOL (N/A )  Patient Location: PACU  Anesthesia Type:General  Level of Consciousness: drowsy  Airway & Oxygen Therapy: Patient Spontanous Breathing  Post-op Assessment: Report given to RN and Post -op Vital signs reviewed and stable  Post vital signs: Reviewed and stable  Last Vitals:  Vitals Value Taken Time  BP 96/45 12/02/19 0854  Temp    Pulse 66 12/02/19 0854  Resp 12 12/02/19 0854  SpO2 96 % 12/02/19 0854  Vitals shown include unvalidated device data.  Last Pain:  Vitals:   12/02/19 0726  TempSrc: Temporal  PainSc: 0-No pain         Complications: No complications documented.

## 2019-12-02 NOTE — Op Note (Signed)
[facility] Gastroenterology Patient Name: Brian Hancock Procedure Date: 12/02/2019 6:58 AM MRN: [id number] Account #: [id number] Date of Birth: 1995-05-26 Admit Type: Outpatient Age: 24 Room: [facility] ENDO ROOM 4 Gender: Male Note Status: Finalized Procedure:             Upper GI endoscopy Indications:           Esophageal dysphagia, Follow-up of eosinophilic                         esophagitis Providers:             Lin Landsman MD, MD Referring MD:          none Medicines:             Monitored Anesthesia Care Complications:         No immediate complications. Estimated blood loss: None. Procedure:             Pre-Anesthesia Assessment:                        - Prior to the procedure, a History and Physical was                         performed, and patient medications and allergies were                         reviewed. The patient is competent. The risks and                         benefits of the procedure and the sedation options and                         risks were discussed with the patient. All questions                         were answered and informed consent was obtained.                         Patient identification and proposed procedure were                         verified by the physician, the nurse, the                         anesthesiologist, the anesthetist and the technician                         in the pre-procedure area in the procedure room in the                         endoscopy suite. Mental Status Examination: alert and                         oriented. Airway Examination: normal oropharyngeal                         airway and neck mobility. Respiratory Examination:  clear to auscultation. CV Examination: normal.                         Prophylactic Antibiotics: The patient does not require                         prophylactic antibiotics. Prior Anticoagulants: The                         patient has  taken no previous anticoagulant or                         antiplatelet agents. ASA Grade Assessment: I - A                         normal, healthy patient. After reviewing the risks and                         benefits, the patient was deemed in satisfactory                         condition to undergo the procedure. The anesthesia                         plan was to use monitored anesthesia care (MAC).                         Immediately prior to administration of medications,                         the patient was re-assessed for adequacy to receive                         sedatives. The heart rate, respiratory rate, oxygen                         saturations, blood pressure, adequacy of pulmonary                         ventilation, and response to care were monitored                         throughout the procedure. The physical status of the                         patient was re-assessed after the procedure.                        After obtaining informed consent, the endoscope was                         passed under direct vision. Throughout the procedure,                         the patient's blood pressure, pulse, and oxygen                         saturations were monitored continuously. The Endoscope  was introduced through the mouth, and advanced to the                         second part of duodenum. The upper GI endoscopy was                         accomplished without difficulty. The patient tolerated                         the procedure well. Findings:      The examined duodenum was normal.      Diffuse moderately erythematous mucosa without bleeding was found in the       gastric body and in the gastric antrum. Biopsies were taken with a cold       forceps for Helicobacter pylori testing.      The gastroesophageal junction and examined esophagus were normal.       Biopsies were obtained from the proximal and distal esophagus with cold        forceps for histology of suspected eosinophilic esophagitis.      The cardia and gastric fundus were normal on retroflexion. Impression:            - Normal examined duodenum.                        - Erythematous mucosa in the gastric body and antrum.                         Biopsied.                        - Normal gastroesophageal junction and esophagus.                         Biopsied. Recommendation:        - Discharge patient to home (with escort).                        - Resume previous diet today.                        - Continue present medications.                        - Await pathology results.                        - Return to my office as previously scheduled. Procedure Code(s):     --- Professional ---                        [phone], Esophagogastroduodenoscopy, flexible,                         transoral; with biopsy, single or multiple Diagnosis Code(s):     --- Professional ---                        K31.89, Other diseases of stomach and duodenum                        R13.14, Dysphagia, pharyngoesophageal phase  Q11.9, Eosinophilic esophagitis CPT copyright 2019 American Medical Association. All rights reserved. The codes documented in this report are preliminary and upon coder review may  be revised to meet current compliance requirements. Dr. Ulyess Mort Lin Landsman MD, MD 12/02/2019 8:53:27 AM This report has been signed electronically. Number of Addenda: 0 Note Initiated On: 12/02/2019 6:58 AM Estimated Blood Loss:  Estimated blood loss: none.      [facility]

## 2019-12-02 NOTE — Anesthesia Preprocedure Evaluation (Addendum)
Anesthesia Evaluation  Patient identified by MRN, date of birth, ID band Patient awake    Reviewed: Allergy & Precautions, H&P , NPO status , Patient's Chart, lab work & pertinent test results  History of Anesthesia Complications Negative for: history of anesthetic complications  Airway Mallampati: II  TM Distance: >3 FB     Dental  (+) Teeth Intact   Pulmonary neg pulmonary ROS, neg sleep apnea, neg COPD,    breath sounds clear to auscultation       Cardiovascular (-) angina(-) Past MI and (-) Cardiac Stents negative cardio ROS  (-) dysrhythmias  Rhythm:regular Rate:Normal     Neuro/Psych negative neurological ROS  negative psych ROS   GI/Hepatic Neg liver ROS, GERD  Controlled,  Endo/Other  negative endocrine ROS  Renal/GU negative Renal ROS  negative genitourinary   Musculoskeletal   Abdominal   Peds  Hematology negative hematology ROS (+)   Anesthesia Other Findings Past Medical History: No date: GERD (gastroesophageal reflux disease)  Past Surgical History: No date: DENTAL EXAMINATION UNDER ANESTHESIA 08/24/2019: ESOPHAGOGASTRODUODENOSCOPY (EGD) WITH PROPOFOL; N/A     Comment:  Procedure: ESOPHAGOGASTRODUODENOSCOPY (EGD) WITH               PROPOFOL;  Surgeon: Toney Reil, MD;  Location:               [facility] ENDOSCOPY;  Service: Gastroenterology;  Laterality:               N/A;  BMI    Body Mass Index: 26.63 kg/m      Reproductive/Obstetrics negative OB ROS                            Anesthesia Physical Anesthesia Plan  ASA: I  Anesthesia Plan: General   Post-op Pain Management:    Induction:   PONV Risk Score and Plan: Propofol infusion and TIVA  Airway Management Planned:   Additional Equipment:   Intra-op Plan:   Post-operative Plan:   Informed Consent: I have reviewed the patients History and Physical, chart, labs and discussed the procedure  including the risks, benefits and alternatives for the proposed anesthesia with the patient or authorized representative who has indicated his/her understanding and acceptance.     Dental Advisory Given  Plan Discussed with: Anesthesiologist, CRNA and Surgeon  Anesthesia Plan Comments:         Anesthesia Quick Evaluation

## 2019-12-03 ENCOUNTER — Encounter: Payer: Self-pay | Admitting: Gastroenterology

## 2019-12-03 LAB — SURGICAL PATHOLOGY

## 2019-12-15 ENCOUNTER — Other Ambulatory Visit: Payer: Self-pay | Admitting: Gastroenterology

## 2019-12-15 DIAGNOSIS — K200 Eosinophilic esophagitis: Secondary | ICD-10-CM

## 2019-12-15 NOTE — Telephone Encounter (Signed)
Last office visit 09/24/2019 eosinophilic esophagitis  Last refill 09/24/2019 2 refills  Has appointment 12/25/2019

## 2019-12-25 ENCOUNTER — Encounter: Payer: Self-pay | Admitting: Gastroenterology

## 2019-12-25 ENCOUNTER — Other Ambulatory Visit: Payer: Self-pay

## 2019-12-25 ENCOUNTER — Ambulatory Visit (INDEPENDENT_AMBULATORY_CARE_PROVIDER_SITE_OTHER): Payer: 59 | Admitting: Gastroenterology

## 2019-12-25 VITALS — BP 128/70 | HR 86 | Temp 97.9°F | Wt 170.0 lb

## 2019-12-25 DIAGNOSIS — K200 Eosinophilic esophagitis: Secondary | ICD-10-CM | POA: Diagnosis not present

## 2019-12-25 NOTE — Progress Notes (Signed)
Arlyss Repress, MD [address]  [address]  [place], [place] [address]  Main: [phone]  Fax: [phone]    Gastroenterology Consultation  Referring Provider:     No ref. provider found Primary Care Physician:  Patient, No Pcp Per Primary Gastroenterologist:  Dr. Arlyss Repress Reason for Consultation:     Eosinophilic esophagitis        HPI:   Brian Hancock is a 24 y.o. male referred by Dr. Patient, No Pcp Per  for consultation & management of history of eosinophilic esophagitis.  Patient was originally seen at [facility] ER on 08/18/2019 secondary to progressively worsening dysphagia to solids associated with choking.  He was originally prescribed a short course of prednisone as it was thought that he may had an allergic reaction.  However, given that his symptoms progressed, he was referred to GI.  I performed his upper endoscopy in 08/2019, confirmed eosinophilic esophagitis.  He was maintained on omeprazole twice daily.  Today, he is here to establish care as outpatient.  Patient is accompanied by his stepmother.  He reports that since his upper endoscopy, he did not have further episodes of dysphagia.  He thinks his symptoms have resolved and he is able to tolerate regular foods.  His weight has been stable.  He works for a [employer] company  Follow-up visit 12/25/2019 Mavin is here for follow-up of eosinophilic esophagitis. He denies any symptoms today. Last night, he had shrimp for dinner that resulted in severe heartburn. He underwent follow-up upper endoscopy for eosinophilic esophagitis, biopsies revealed significant improvement in eosinophil count on twice daily PPI dosing. He continues to take omeprazole 40 mg twice daily  He does admit to seasonal allergies.  He denies history of allergic asthma He denies family history of EOE, autoimmune conditions or asthma  NSAIDs: None  Antiplts/Anticoagulants/Anti thrombotics: None  GI Procedures:  Upper endoscopy 12/02/2019 - Normal  examined duodenum. - Erythematous mucosa in the gastric body and antrum. Biopsied. - Normal gastroesophageal junction and esophagus. Biopsied.  DIAGNOSIS:  A. STOMACH, RANDOM; COLD BIOPSY:  - GASTRIC ANTRAL AND OXYNTIC MUCOSA WITH FEATURES OF REACTIVE  GASTROPATHY AND MILD CHRONIC INACTIVE GASTRITIS.  - NEGATIVE FOR H. PYLORI, DYSPLASIA, AND MALIGNANCY.   Comment:  The differential diagnosis for these findings includes drug/chemical  injury (NSAID vs. other), bile reflux, and changes adjacent to an area  of healing ulceration. Clinical correlation with endoscopic findings is  required.   B. ESOPHAGUS, DISTAL; COLD BIOPSY:  - BENIGN SQUAMOUS MUCOSA WITH MILD ACANTHOSIS.  - NO INCREASE IN INTRAEPITHELIAL EOSINOPHILS (UP TO 2 PER HPF).  - NEGATIVE FOR DYSPLASIA AND MALIGNANCY.   C. ESOPHAGUS, PROXIMAL; COLD BIOPSY:  - BENIGN SQUAMOUS MUCOSA WITH NO SIGNIFICANT HISTOPATHOLOGIC CHANGE.  - NO INCREASE IN INTRAEPITHELIAL EOSINOPHILS (UP TO 2 PER HPF).  - NEGATIVE FOR DYSPLASIA AND MALIGNANCY.   EGD 08/30/2019 - Normal duodenal bulb and second portion of the duodenum. - Normal stomach. - Esophagogastric landmarks identified. - Esophageal mucosal changes suspicious for eosinophilic esophagitis. Biopsied.  DIAGNOSIS:  A. ESOPHAGUS, DISTAL; COLD BIOPSY:  - BENIGN SQUAMOUS MUCOSA WITH ACANTHOSIS.  - MILD INCREASE IN INTRAEPITHELIAL EOSINOPHILS (UP TO 6 PER HPF)  - NEGATIVE FOR DYSPLASIA OR MALIGNANCY.   B. ESOPHAGUS, PROXIMAL; COLD BIOPSY:  - BENIGN SQUAMOUS MUCOSA WITH ACANTHOSIS.  - MODERATE INCREASE IN INTRAEPITHELIAL EOSINOPHILS (UP TO 15 PER HPF IN  ONE FOCUS).  - NEGATIVE FOR DYSPLASIA AND MALIGNANCY.   Past Medical History:  Diagnosis Date  .  GERD (gastroesophageal reflux disease)     Past Surgical History:  Procedure Laterality Date  . DENTAL EXAMINATION UNDER ANESTHESIA    . ESOPHAGOGASTRODUODENOSCOPY (EGD) WITH PROPOFOL N/A 08/24/2019   Procedure:  ESOPHAGOGASTRODUODENOSCOPY (EGD) WITH PROPOFOL;  Surgeon: Toney Reil, MD;  Location: [facility] ENDOSCOPY;  Service: Gastroenterology;  Laterality: N/A;  . ESOPHAGOGASTRODUODENOSCOPY (EGD) WITH PROPOFOL N/A 12/02/2019   Procedure: ESOPHAGOGASTRODUODENOSCOPY (EGD) WITH PROPOFOL;  Surgeon: Toney Reil, MD;  Location[facility] ENDOSCOPY;  Service: Gastroenterology;  Laterality: N/A;    Current Outpatient Medications:  .  omeprazole (PRILOSEC) 40 MG capsule, TAKE 1 CAPSULE (40 MG TOTAL) BY MOUTH 2 (TWO) TIMES DAILY BEFORE A MEAL., Disp: 180 capsule, Rfl: 0    Family History  Problem Relation Age of Onset  . Skin cancer Mother   . Skin cancer Maternal Grandmother   . Skin cancer Paternal Grandfather      Social History   Tobacco Use  . Smoking status: Never Smoker  . Smokeless tobacco: Current User    Types: Chew  Vaping Use  . Vaping Use: Never used  Substance Use Topics  . Alcohol use: Yes    Alcohol/week: 4.0 standard drinks    Types: 4 Cans of beer per week    Comment: a week  . Drug use: Never    Allergies as of 12/25/2019  . (No Known Allergies)    Review of Systems:    All systems reviewed and negative except where noted in HPI.   Physical Exam:  BP 128/70 (BP Location: Left Arm, Patient Position: Sitting, Cuff Size: Normal)   Pulse 86   Temp 97.9 F (36.6 C) (Oral)   Wt 170 lb (77.1 kg)   BMI 26.63 kg/m  No LMP for male patient.  General:   Alert,  Well-developed, well-nourished, pleasant and cooperative in NAD Head:  Normocephalic and atraumatic. Eyes:  Sclera clear, no icterus.   Conjunctiva pink. Ears:  Normal auditory acuity. Nose:  No deformity, discharge, or lesions. Mouth:  No deformity or lesions,oropharynx pink & moist. Neck:  Supple; no masses or thyromegaly. Lungs:  Respirations even and unlabored.  Clear throughout to auscultation.   No wheezes, crackles, or rhonchi. No acute distress. Heart:  Regular rate and rhythm; no murmurs, clicks,  rubs, or gallops. Abdomen:  Normal bowel sounds. Soft, non-tender and non-distended without masses, hepatosplenomegaly or hernias noted.  No guarding or rebound tenderness.   Rectal: Not performed Msk:  Symmetrical without gross deformities. Good, equal movement & strength bilaterally. Pulses:  Normal pulses noted. Extremities:  No clubbing or edema.  No cyanosis. Neurologic:  Alert and oriented x3;  grossly normal neurologically. Skin:  Intact without significant lesions or rashes. No jaundice. Psych:  Alert and cooperative. Normal mood and affect.  Imaging Studies: Reviewed  Assessment and Plan:   VITTORIO MOHS is a 24 y.o. male with no significant past medical history, is here for follow-up of eosinophilic esophagitis  Eosinophilic esophagitis Repeat EGD revealed significant improvement in eosinophil count Decrease omeprazole to 40 mg once a day for 3 months then 20 mg once a day for 1 month Advised patient to contact me if symptoms recur, recommend trial of swallowed steroid at that time  I have discussed with him various management options of EOE such as PPI use, steroid use, 6 food elimination diet.  I have also discussed with him the sequelae if left untreated including complications  Follow up in 6 months   Arlyss Repress, MD

## 2020-03-11 ENCOUNTER — Telehealth: Payer: 59 | Admitting: Physician Assistant

## 2020-03-11 DIAGNOSIS — B9789 Other viral agents as the cause of diseases classified elsewhere: Secondary | ICD-10-CM

## 2020-03-11 DIAGNOSIS — J019 Acute sinusitis, unspecified: Secondary | ICD-10-CM | POA: Diagnosis not present

## 2020-03-11 MED ORDER — FLUTICASONE PROPIONATE 50 MCG/ACT NA SUSP: 2.0000 | Freq: Every day | NASAL | 0 refills | Status: AC

## 2020-03-11 NOTE — Progress Notes (Signed)

## 2020-05-23 ENCOUNTER — Telehealth: Payer: Self-pay | Admitting: Gastroenterology

## 2020-05-23 NOTE — Telephone Encounter (Signed)
Omeprazole (generic name) or Prilosec (brand name) has not been recalled.  And, it is not associated with liver failure.  If he is really concerned, we can check his liver enzymes.  I am not worried about it  If he really does not want to take omeprazole, recommend pantoprazole 40 mg daily before breakfast  Arlyss Repress, MD [address]  [address]  [place], [place] [address]  Main: [phone]  Fax: [phone] Pager: [phone]

## 2020-05-23 NOTE — Telephone Encounter (Signed)
Tried to call patient but number is busy will try again at a later time  

## 2020-05-23 NOTE — Telephone Encounter (Signed)
Patient would like to change his medication from Omeprazole to something else. Please call to advise

## 2020-05-23 NOTE — Telephone Encounter (Signed)
Patient verbalized understanding and he states he will stay on the omeprazole. Patient states he does not want his labs checked

## 2020-05-23 NOTE — Telephone Encounter (Signed)
Patient states that there has been recall on on some of the brands of the omeprazole. He states it caused liver damage and failure. States he tried to come off medication but started having symptoms. He states he is taking omeprazole 20mg  daily. He wants to take something else that is comparable

## 2020-08-10 ENCOUNTER — Telehealth: Payer: Self-pay

## 2020-08-10 MED ORDER — OMEPRAZOLE 20 MG PO CPDR: 20.0000 mg | DELAYED_RELEASE_CAPSULE | Freq: Every day | ORAL | 1 refills | Status: DC

## 2020-08-10 NOTE — Telephone Encounter (Signed)
Patient states that he wants a omeprazole 20mg  refilled. Informed patient he needs a follow up appointment. Made appointment for 09/23/2020. Patient was taken in his chart omeprazole 40mg  2 times a day. Patient states he only wants refilled omeprazole 20mg  once a day. Please advised if this can be done

## 2020-08-10 NOTE — Addendum Note (Signed)
Addended by: Radene Knee L on: 08/10/2020 04:11 PM   Modules accepted: Orders

## 2020-08-10 NOTE — Telephone Encounter (Signed)
Sent medication to the pharmacy  

## 2020-08-10 NOTE — Telephone Encounter (Signed)
Yes, go ahead and send in prescription for omeprazole 20 mg once a day for 1 month  RV

## 2020-09-03 ENCOUNTER — Other Ambulatory Visit: Payer: Self-pay | Admitting: Gastroenterology

## 2020-09-23 ENCOUNTER — Ambulatory Visit: Payer: 59 | Admitting: Gastroenterology

## 2020-10-07 ENCOUNTER — Other Ambulatory Visit: Payer: Self-pay | Admitting: Gastroenterology

## 2020-11-09 ENCOUNTER — Other Ambulatory Visit: Payer: Self-pay | Admitting: Gastroenterology

## 2021-08-14 IMAGING — RF DG ESOPHAGUS
8 series · 15 of 24 positions shown · non-contrast
Comparison: None.

CLINICAL DATA: Difficulty swallowing

EXAM:
ESOPHOGRAM/BARIUM SWALLOW
TECHNIQUE: Single contrast examination was performed using thin barium or water
soluble.
FLUOROSCOPY TIME:  Fluoroscopy Time:  1 minutes 18 seconds
Radiation Exposure Index (if provided by the fluoroscopic device):
24 mGy
Number of Acquired Spot Images: Multiple cine fluoroscopic runs

[Series 1: cp_standard · 0.17mm/px · 2 of 73 frames shown (1 of 5)]
[frame 11/73]
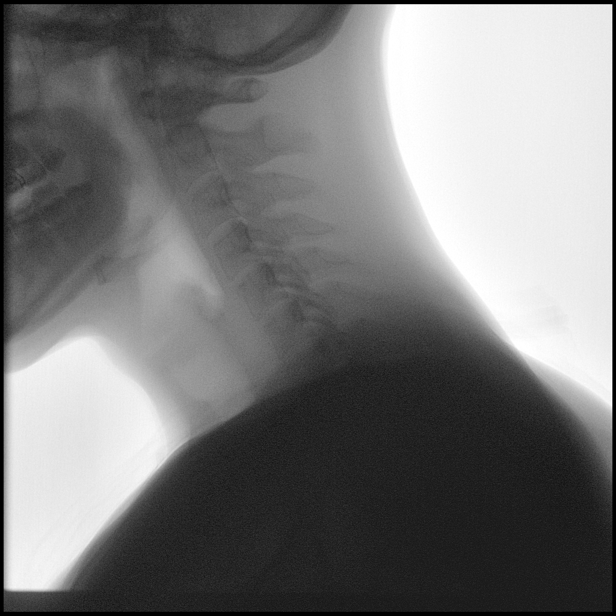
[frame 48/73]
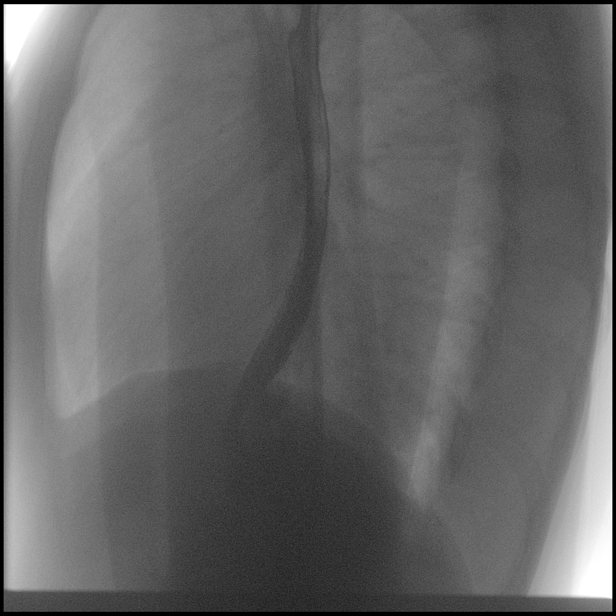

[Series 2: cp_standard · 0.17mm/px · 2 of 92 frames shown (2 of 5)]
[frame 30/92]
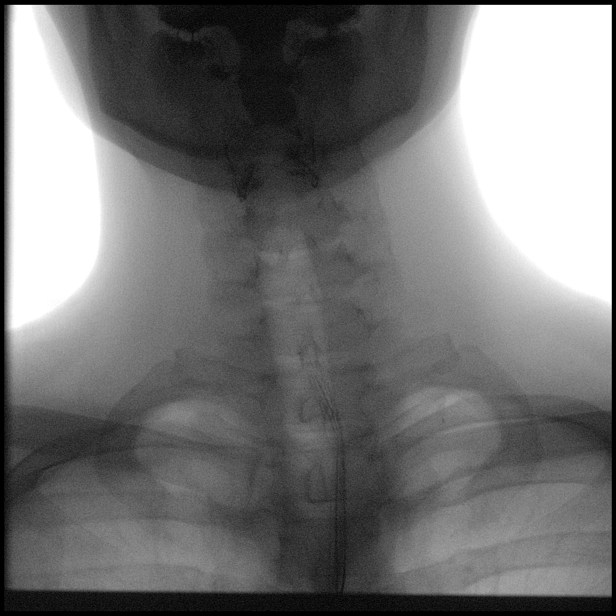
[frame 47/92]
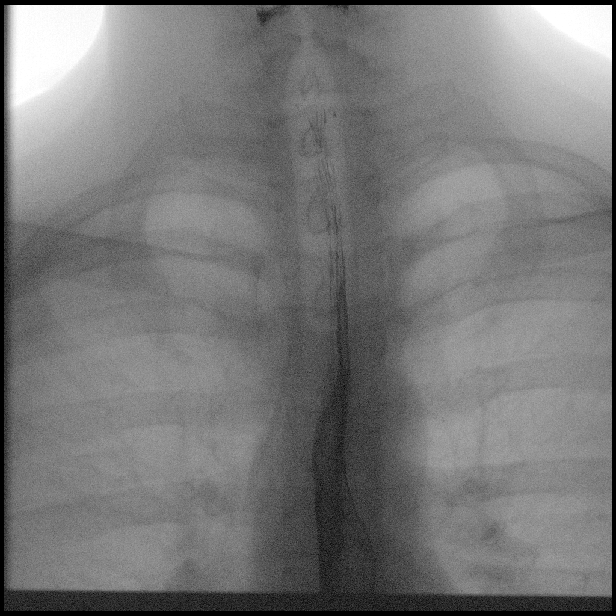

[Series 3: fluoro_barium 2fps_bw · 0.17mm/px · 2 of 5 frames shown (1 of 3)]
[frame 1/5]
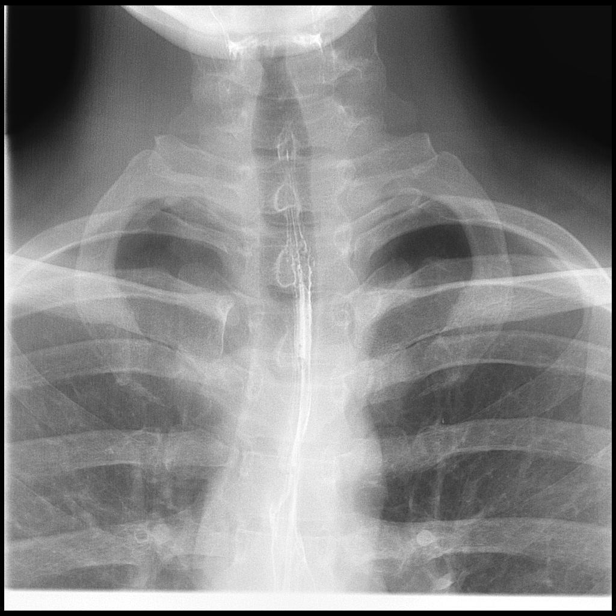
[frame 3/5]
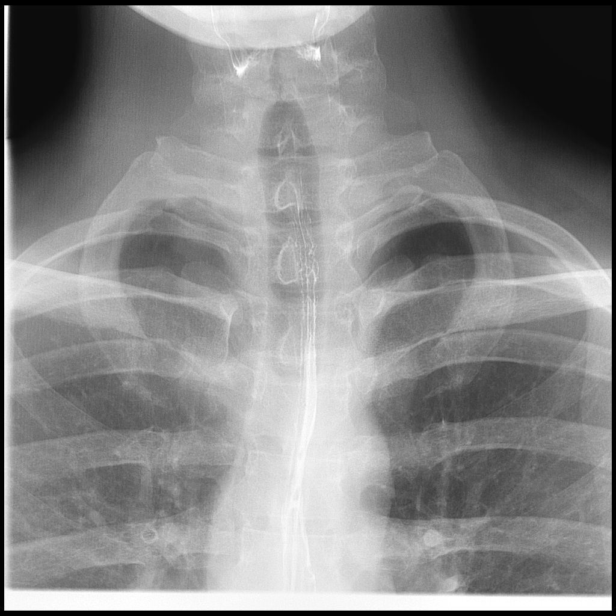

[Series 4: fluoro_barium 2fps_bw · 0.17mm/px · 2 of 20 frames shown (2 of 3)]
[frame 11/20]
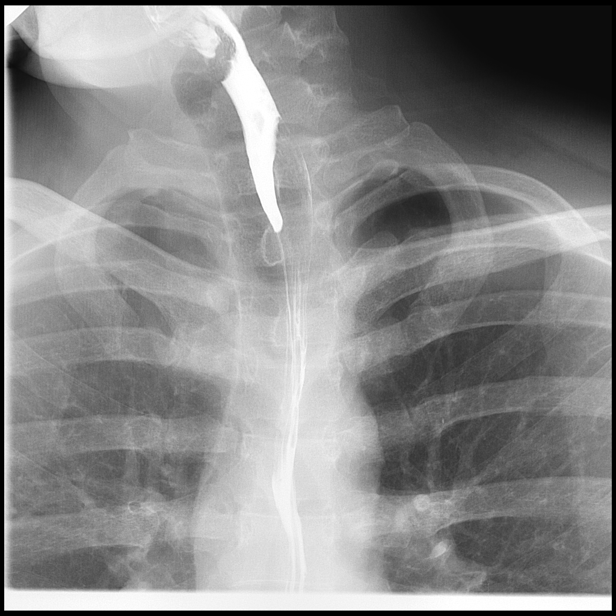
[frame 18/20]
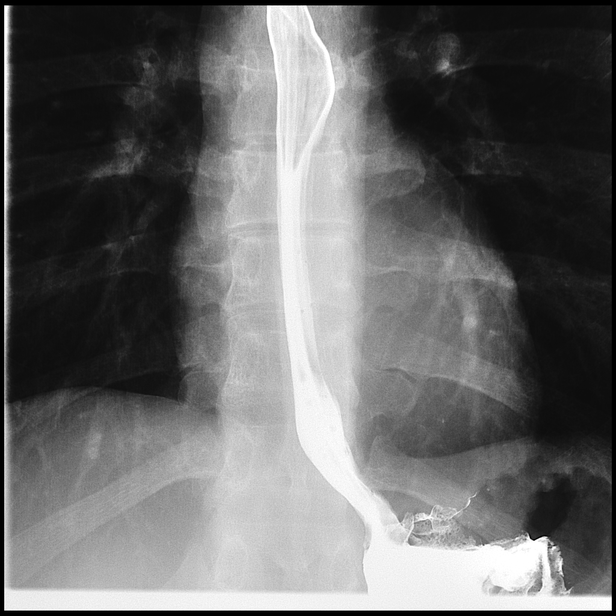

[Series 5: cp_standard · 0.17mm/px · 2 of 69 frames shown (3 of 5)]
[frame 11/69]
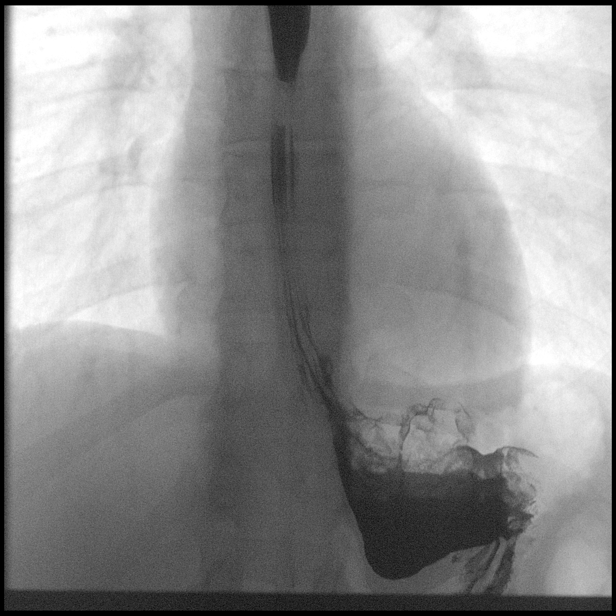
[frame 59/69]
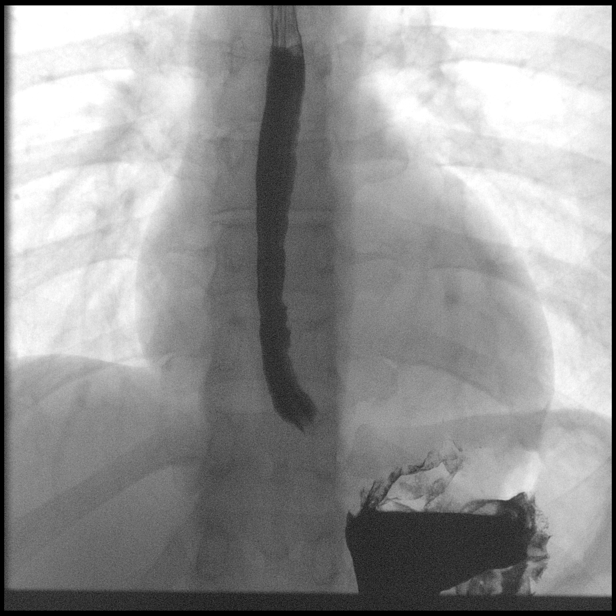

[Series 6: cp_standard · 0.18mm/px · 2 of 50 frames shown (4 of 5)]
[frame 8/50]
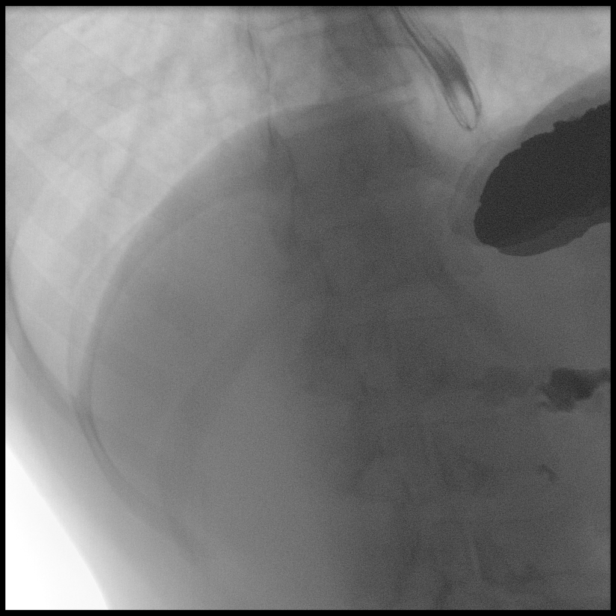
[frame 27/50]
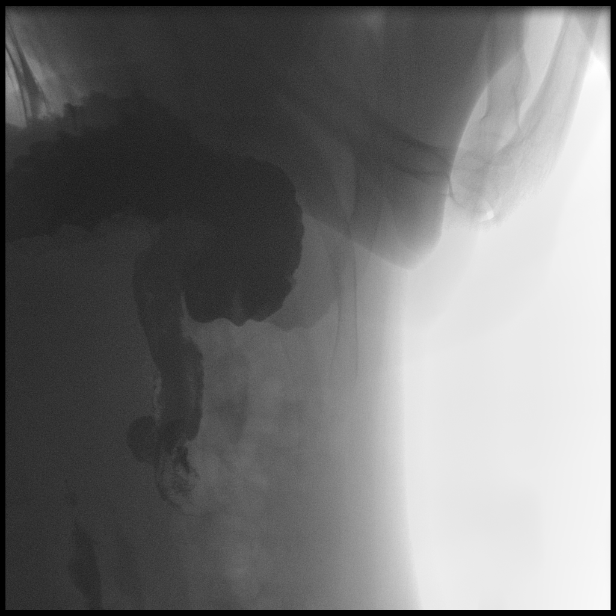

[Series 7: fluoro_barium 2fps_bw · 0.18mm/px · 1 of 1 slices shown (3 of 3)]
[im 1/1]
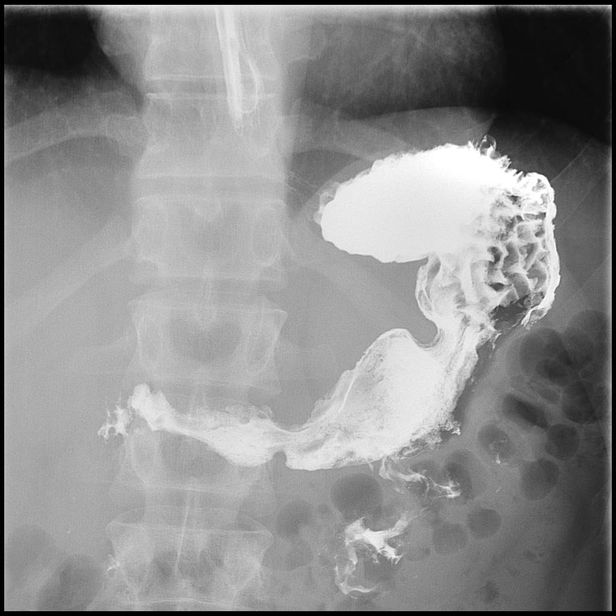

[Series 8: cp_standard · 0.18mm/px · 2 of 91 frames shown (5 of 5)]
[frame 19/91]
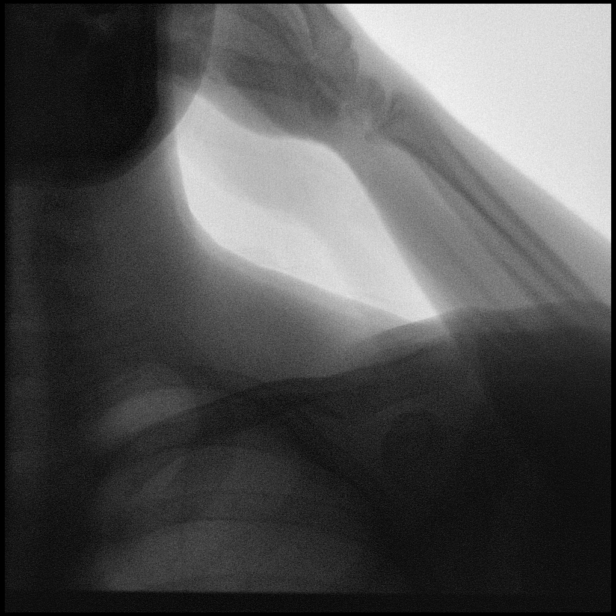
[frame 78/91]
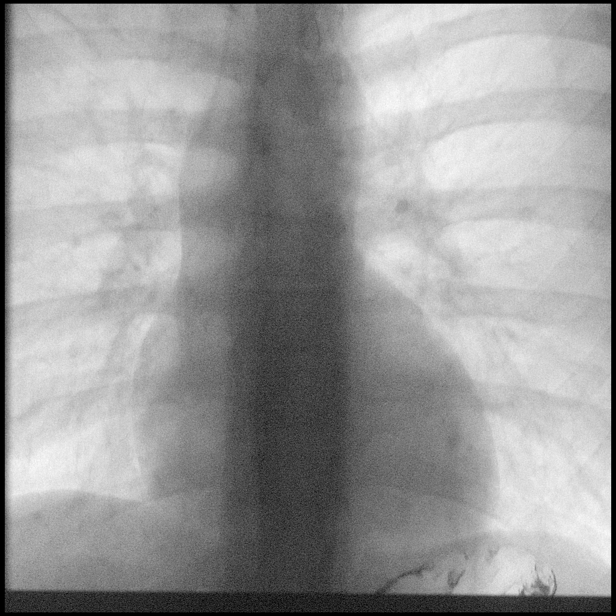

[15 of 24 positions shown; findings below may reference images not displayed]

FINDINGS: Swallowing mechanism and esophageal transit time are within normal
limits. No mucosal abnormality is noted. No reflux or reflux
esophagitis is seen. No filling defects are noted. Barium tablet
passes without difficulty.
IMPRESSION: Unremarkable barium swallow.
